# Patient Record
Sex: Female | Born: 1975 | Race: White | Hispanic: Yes | Marital: Married | Smoking: Never smoker
Health system: Southern US, Community
[De-identification: ages and names within clinical notes are randomized; demographics above are authoritative.]

## PROBLEM LIST (undated history)

## (undated) HISTORY — PX: OTHER SURGICAL HISTORY: SHX169

## (undated) HISTORY — PX: PLACEMENT OF BREAST IMPLANTS: SHX6334

## (undated) HISTORY — PX: BREAST CYST EXCISION: SHX579

## (undated) HISTORY — PX: AUGMENTATION MAMMAPLASTY: SUR837

---

## 1999-05-25 ENCOUNTER — Emergency Department (HOSPITAL_COMMUNITY): Admission: EM | Admit: 1999-05-25 | Discharge: 1999-05-25 | Payer: Self-pay | Admitting: Emergency Medicine

## 1999-07-31 ENCOUNTER — Ambulatory Visit (HOSPITAL_COMMUNITY): Admission: RE | Admit: 1999-07-31 | Discharge: 1999-07-31 | Payer: Self-pay | Admitting: Gynecology

## 1999-12-20 ENCOUNTER — Inpatient Hospital Stay (HOSPITAL_COMMUNITY): Admission: AD | Admit: 1999-12-20 | Discharge: 1999-12-22 | Payer: Self-pay | Admitting: Obstetrics and Gynecology

## 2000-08-13 ENCOUNTER — Ambulatory Visit (HOSPITAL_COMMUNITY): Admission: RE | Admit: 2000-08-13 | Discharge: 2000-08-13 | Payer: Self-pay | Admitting: Family Medicine

## 2000-08-13 ENCOUNTER — Encounter: Payer: Self-pay | Admitting: Family Medicine

## 2001-08-15 ENCOUNTER — Other Ambulatory Visit: Admission: RE | Admit: 2001-08-15 | Discharge: 2001-08-15 | Payer: Self-pay | Admitting: Obstetrics and Gynecology

## 2001-08-23 ENCOUNTER — Encounter: Payer: Self-pay | Admitting: Obstetrics and Gynecology

## 2001-08-23 ENCOUNTER — Encounter: Admission: RE | Admit: 2001-08-23 | Discharge: 2001-08-23 | Payer: Self-pay | Admitting: Obstetrics and Gynecology

## 2002-02-10 ENCOUNTER — Encounter: Admission: RE | Admit: 2002-02-10 | Discharge: 2002-02-10 | Payer: Self-pay | Admitting: Obstetrics and Gynecology

## 2002-02-10 ENCOUNTER — Encounter: Payer: Self-pay | Admitting: Obstetrics and Gynecology

## 2002-09-08 ENCOUNTER — Other Ambulatory Visit: Admission: RE | Admit: 2002-09-08 | Discharge: 2002-09-08 | Payer: Self-pay | Admitting: Obstetrics and Gynecology

## 2003-02-14 ENCOUNTER — Ambulatory Visit (HOSPITAL_COMMUNITY): Admission: RE | Admit: 2003-02-14 | Discharge: 2003-02-14 | Payer: Self-pay | Admitting: Plastic Surgery

## 2003-02-14 ENCOUNTER — Ambulatory Visit (HOSPITAL_BASED_OUTPATIENT_CLINIC_OR_DEPARTMENT_OTHER): Admission: RE | Admit: 2003-02-14 | Discharge: 2003-02-14 | Payer: Self-pay | Admitting: Plastic Surgery

## 2003-09-20 ENCOUNTER — Other Ambulatory Visit: Admission: RE | Admit: 2003-09-20 | Discharge: 2003-09-20 | Payer: Self-pay | Admitting: Obstetrics and Gynecology

## 2013-12-14 ENCOUNTER — Ambulatory Visit (INDEPENDENT_AMBULATORY_CARE_PROVIDER_SITE_OTHER): Payer: Self-pay | Admitting: Otolaryngology

## 2014-01-11 ENCOUNTER — Ambulatory Visit (INDEPENDENT_AMBULATORY_CARE_PROVIDER_SITE_OTHER): Payer: Managed Care, Other (non HMO) | Admitting: Otolaryngology

## 2014-01-11 DIAGNOSIS — H6120 Impacted cerumen, unspecified ear: Secondary | ICD-10-CM | POA: Diagnosis not present

## 2014-01-11 DIAGNOSIS — H9319 Tinnitus, unspecified ear: Secondary | ICD-10-CM | POA: Diagnosis not present

## 2014-01-11 DIAGNOSIS — H905 Unspecified sensorineural hearing loss: Secondary | ICD-10-CM

## 2014-07-19 ENCOUNTER — Ambulatory Visit (INDEPENDENT_AMBULATORY_CARE_PROVIDER_SITE_OTHER): Payer: Managed Care, Other (non HMO) | Admitting: Otolaryngology

## 2014-07-19 DIAGNOSIS — H9042 Sensorineural hearing loss, unilateral, left ear, with unrestricted hearing on the contralateral side: Secondary | ICD-10-CM

## 2015-03-07 ENCOUNTER — Ambulatory Visit (INDEPENDENT_AMBULATORY_CARE_PROVIDER_SITE_OTHER): Payer: Managed Care, Other (non HMO) | Admitting: Otolaryngology

## 2015-03-07 DIAGNOSIS — H9042 Sensorineural hearing loss, unilateral, left ear, with unrestricted hearing on the contralateral side: Secondary | ICD-10-CM

## 2015-09-05 ENCOUNTER — Ambulatory Visit (INDEPENDENT_AMBULATORY_CARE_PROVIDER_SITE_OTHER): Payer: Managed Care, Other (non HMO) | Admitting: Otolaryngology

## 2015-09-05 DIAGNOSIS — H9042 Sensorineural hearing loss, unilateral, left ear, with unrestricted hearing on the contralateral side: Secondary | ICD-10-CM

## 2016-09-03 ENCOUNTER — Ambulatory Visit (INDEPENDENT_AMBULATORY_CARE_PROVIDER_SITE_OTHER): Payer: Managed Care, Other (non HMO) | Admitting: Otolaryngology

## 2019-08-15 ENCOUNTER — Ambulatory Visit (INDEPENDENT_AMBULATORY_CARE_PROVIDER_SITE_OTHER): Payer: Self-pay | Admitting: Plastic Surgery

## 2019-08-15 ENCOUNTER — Other Ambulatory Visit: Payer: Self-pay

## 2019-08-15 ENCOUNTER — Encounter: Payer: Self-pay | Admitting: Plastic Surgery

## 2019-08-15 DIAGNOSIS — N6481 Ptosis of breast: Secondary | ICD-10-CM | POA: Insufficient documentation

## 2019-08-15 NOTE — Progress Notes (Signed)
Patient ID: Stacey Peters, female    DOB: [birth year], 44 y.o.   MRN: [id number]   Chief Complaint  Patient presents with  . Advice Only    for breast ptosis  . Breast Problem    The patient is a 44 year old female here for evaluation of her breasts.  She was referred by Dr. Benna Dunks.  The patient had implants placed in 2004 when she lived in [place].  She is not sure if they were saline or silicone.  She is not sure what size they were.  She went from a B cup of her bra to a DD bra cup.  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  She moved to [place] in 2012.  Dr. Benna Dunks removed the implants at that time.  She has not had any further surgery.  She had a mammogram last week and has dense breast tissue but no other issues noted.  She is 5 feet 4 inches tall and weighs 116 pounds.  She does not like the way her breasts look and would like to have them lifted.  On exam she has minimal breast tissue and loss of superior pole volume.  She is got some contracture at the lower medial pole of the left breast.  She does have breast asymmetry as well with the left looking a little bit smaller and more contracted than the right.  No lumps bumps or concerning areas for masses.  Her circumareolar scar is well-healed.  She is not sure if her implants were submuscular or subglandular.   Review of Systems  Constitutional: Negative.   HENT: Negative.   Eyes: Negative.   Respiratory: Negative.   Cardiovascular: Negative.   Gastrointestinal: Negative.   Endocrine: Negative.   Genitourinary: Negative.   Musculoskeletal: Negative.   Hematological: Negative.   Psychiatric/Behavioral: Negative.     History reviewed. No pertinent past medical history.  History reviewed. No pertinent surgical history.   No current outpatient medications on file.   Objective:   Vitals:   08/15/19 1311  BP: 105/67  Pulse: 83  Temp: 98 F (36.7 C)  SpO2: 100%    Physical  Exam Vitals and nursing note reviewed.  Constitutional:      Appearance: Normal appearance.  HENT:     Head: Normocephalic and atraumatic.  Cardiovascular:     Rate and Rhythm: Normal rate.     Pulses: Normal pulses.  Pulmonary:     Effort: Pulmonary effort is normal.  Abdominal:     General: Abdomen is flat. There is no distension.     Tenderness: There is no abdominal tenderness.  Skin:    General: Skin is warm.     Capillary Refill: Capillary refill takes less than 2 seconds.  Neurological:     General: No focal deficit present.     Mental Status: She is alert and oriented to person, place, and time.  Psychiatric:        Mood and Affect: Mood normal.        Behavior: Behavior normal.     Assessment & Plan:  Ptosis of both breasts   We discussed the options for implant-based mastopexy augmentation and mastopexy without an augmentation.  I think that she would look better with the augmentation mastopexy but I am very concerned about her previous history and the trouble that she may have with the implants.  I do not see any other great way of getting her more  volume.  Due to the areolar incision I do not think I could use the middle inferior portion of her breast to augment.  I think at best she could get a little bit tighter skin and the slightly higher areola positioning.  I recommend that she think it over and talk with her husband.  I am certainly available as needed.  She was given a quote for a mastopexy.   I did not give her a quote for the augmentation as I am aware Dr. Stephanie Coup could do this for her as well.  Pictures were obtained of the patient and placed in the chart with the patient's or guardian's permission.    Wallace Going, DO   The 21st Century Cures Act was signed into law in 2016 which includes the topic of electronic health records.  This provides immediate access to information in MyChart.  This includes consultation notes, operative notes, office  notes, lab results and pathology reports.  If you have any questions about what you read please let us know at your next visit or call us at the office.  We are right here with you.

## 2020-07-27 DIAGNOSIS — K6389 Other specified diseases of intestine: Secondary | ICD-10-CM | POA: Insufficient documentation

## 2020-07-27 NOTE — Progress Notes (Signed)
New Patient Office Visit  Subjective:  Patient ID: Stacey Peters, female    DOB: [birth year]  Age: 45 y.o. MRN: [id number]  CC:  Chief Complaint  Patient presents with  . Annual Exam    HPI Stacey Peters presents for establishment of care. She denies any acute complaints at today's visit.   Current Medications -no active medications  Medication Allergies -No known medication allergies  Past medical history  -small intestinal bacterial overgrowth  Surgical history:  -breast implant (bilateral)--date unknown -breast implant removal (bilateral)-2018 -breast lift (bilateral)-2018  Family History -father-colon cancer-deceased  Social History   Socioeconomic History  . Marital status: Married    Spouse name: Not on file  . Number of children: Not on file  . Years of education: Not on file  . Highest education level: Not on file  Occupational History  . Occupation: Customer service manager  Tobacco Use  . Smoking status: Never Smoker  . Smokeless tobacco: Never Used  Vaping Use  . Vaping Use: Never used  Substance and Sexual Activity  . Alcohol use: Yes    Comment: socially  . Drug use: Never  . Sexual activity: Not on file  Other Topics Concern  . Not on file  Social History Narrative  . Not on file   Social Determinants of Health   Financial Resource Strain: Not on file  Food Insecurity: Not on file  Transportation Needs: Not on file  Physical Activity: Not on file  Stress: Not on file  Social Connections: Not on file  Intimate Partner Violence: Not on file    ROS Review of Systems  Constitutional: Negative for appetite change, chills, fatigue, fever and unexpected weight change.  HENT: Positive for postnasal drip, rhinorrhea and sore throat. Negative for sinus pressure, trouble swallowing and voice change.   Eyes: Negative.   Respiratory: Negative for chest tightness and shortness of breath.   Cardiovascular: Negative for chest pain and palpitations.   Gastrointestinal: Negative for abdominal distention, abdominal pain and diarrhea.  Endocrine: Negative for cold intolerance, polydipsia and polyuria.  Genitourinary: Negative for decreased urine volume, dysuria, flank pain, frequency and vaginal discharge.  Musculoskeletal: Negative.   Skin: Negative.   Neurological: Negative.   Psychiatric/Behavioral: Negative.     Objective:   Today's Vitals: BP (!) 87/51 (BP Location: Left Arm, Patient Position: Sitting, Cuff Size: Normal)   Pulse (!) 51   Temp 98.1 F (36.7 C) (Oral)   Ht 5\' 4"  (1.626 m)   Wt 129 lb 9.6 oz (58.8 kg)   SpO2 100%   BMI 22.25 kg/m   Physical Exam Constitutional:      General: She is not in acute distress.    Appearance: Normal appearance.  HENT:     Head: Normocephalic.     Nose: Nose normal.     Mouth/Throat:     Mouth: Mucous membranes are moist.     Pharynx: No oropharyngeal exudate.  Eyes:     Extraocular Movements: Extraocular movements intact.  Cardiovascular:     Rate and Rhythm: Regular rhythm. Bradycardia present.     Heart sounds: No murmur heard. No gallop.   Pulmonary:     Effort: Pulmonary effort is normal.     Breath sounds: Normal breath sounds.  Abdominal:     General: Abdomen is flat.     Palpations: Abdomen is soft.  Musculoskeletal:        General: Normal range of motion.  Skin:    General: Skin  is warm and dry.  Neurological:     General: No focal deficit present.     Mental Status: She is alert and oriented to person, place, and time.  Psychiatric:        Mood and Affect: Mood normal.        Behavior: Behavior normal.     Assessment & Plan:   Problem List Items Addressed This Visit      Cardiovascular and Mediastinum   Hypotension with Bradycardia    Blood pressure was 85/65 and 87/51 on repeat today. Pulse 65 and 51 respectively.  She notes that her blood pressures typically range in the upper 80s to low 100s at baseline. Chart review from care everywhere  confirms this.  She is asymptomatic.  Assessment: Orthostatic vitals are not consistent with orthostasis. EKG was obtained at today's visit as well which confirmed sinus bradycardia She is not on any medications at this time. TSH was normal a few months ago so I don't think we need to check that again. Adrenal insufficiency considered and remains on the differential however she is not symptomatic and recent labs and clinic findings not present (fatigue, wt loss, n/v, abd pain, muscle/joint pain, salt craving, orthostasis)  Plan -obtain AM cortisol this week -BMP/CBC -f/u in 2w for a recheck. If she remains hypotensive/bradycardic at that time, may consider cardiology f/u         Relevant Orders   Cortisol-am, blood     Digestive   Small intestinal bacterial overgrowth    She would like to transfer care from [facility] to a GI office here in [place].  Reports an improvement in GI symptoms since completing treatment. -referral to GI placed      Relevant Orders   Ambulatory referral to Gastroenterology     Other   Healthcare maintenance    Follows with gynecology in [place] for pap smears Last mammogram was 08/07/19 BI-RADS 2-benign HCV and HIV screening tests ordered. Received dose dose series of COVID 19 vaccine--unsure which one. Not interested in the booster.        Other Visit Diagnoses    Bradycardia    -  Primary   Relevant Orders   EKG 12-Lead (Completed)   CBC no Diff   CMP14 + Anion Gap   Screening for HIV (human immunodeficiency virus)       Relevant Orders   HIV antibody (with reflex)   Encounter for HCV screening test for low risk patient       Relevant Orders   Hepatitis C antibody   Encounter for screening mammogram for malignant neoplasm of breast       Relevant Orders   MM Digital Screening      No outpatient encounter medications on file as of 07/29/2020.   No facility-administered encounter medications on file as of 07/29/2020.     Follow-up: Return in about 2 weeks (around 08/12/2020) for Blood pressure recheck.   Patient discussed with Dr. Leo Rod, MD Internal Medicine Resident PGY-2 [facility] Pager: [phone] 07/29/2020 1:59 PM

## 2020-07-29 ENCOUNTER — Other Ambulatory Visit: Payer: Self-pay

## 2020-07-29 ENCOUNTER — Ambulatory Visit (INDEPENDENT_AMBULATORY_CARE_PROVIDER_SITE_OTHER): Payer: Managed Care, Other (non HMO) | Admitting: Internal Medicine

## 2020-07-29 ENCOUNTER — Encounter: Payer: Self-pay | Admitting: Internal Medicine

## 2020-07-29 ENCOUNTER — Ambulatory Visit (HOSPITAL_COMMUNITY)
Admission: RE | Admit: 2020-07-29 | Discharge: 2020-07-29 | Disposition: A | Discharge status: Home / Self Care | Payer: Managed Care, Other (non HMO) | Source: Ambulatory Visit | Attending: Family Medicine | Admitting: Family Medicine

## 2020-07-29 VITALS — BP 87/51 | HR 51 | Temp 98.1°F | Ht 64.0 in | Wt 129.6 lb

## 2020-07-29 DIAGNOSIS — Z1231 Encounter for screening mammogram for malignant neoplasm of breast: Secondary | ICD-10-CM

## 2020-07-29 DIAGNOSIS — I9589 Other hypotension: Secondary | ICD-10-CM | POA: Insufficient documentation

## 2020-07-29 DIAGNOSIS — I959 Hypotension, unspecified: Secondary | ICD-10-CM | POA: Diagnosis not present

## 2020-07-29 DIAGNOSIS — K6389 Other specified diseases of intestine: Secondary | ICD-10-CM

## 2020-07-29 DIAGNOSIS — Z114 Encounter for screening for human immunodeficiency virus [HIV]: Secondary | ICD-10-CM | POA: Diagnosis not present

## 2020-07-29 DIAGNOSIS — Z1159 Encounter for screening for other viral diseases: Secondary | ICD-10-CM

## 2020-07-29 DIAGNOSIS — R001 Bradycardia, unspecified: Secondary | ICD-10-CM

## 2020-07-29 DIAGNOSIS — Z0000 Encounter for general adult medical examination without abnormal findings: Secondary | ICD-10-CM

## 2020-07-29 NOTE — Patient Instructions (Signed)
It was a pleasure meeting you today and I thank you for choosing the [facility] Medicine Center for your primary care.  I would like you to return later this week in the morning so we can check a lab that can only be obtained in the morning. This can be a lab-only appointment, meaning you do not have to see a provider that day.  Please arrive by 8:15 to check any day Monday through Thursday. Our clinic opens later on Fridays so this lab would be less reliable between those times.  I will call you to discuss your labs once they have resulted.

## 2020-07-29 NOTE — Assessment & Plan Note (Addendum)
Follows with gynecology in [place] for pap smears Last mammogram was 08/07/19 BI-RADS 2-benign HCV and HIV screening tests ordered. Received dose dose series of COVID 19 vaccine--unsure which one. Not interested in the booster.

## 2020-07-29 NOTE — Assessment & Plan Note (Signed)
She would like to transfer care from [facility] to a GI office here in [place].  Reports an improvement in GI symptoms since completing treatment. -referral to GI placed

## 2020-07-29 NOTE — Assessment & Plan Note (Addendum)
Blood pressure was 85/65 and 87/51 on repeat today. Pulse 65 and 51 respectively.  She notes that her blood pressures typically range in the upper 80s to low 100s at baseline. Chart review from care everywhere confirms this.  She is asymptomatic.  Assessment: Orthostatic vitals are not consistent with orthostasis. EKG was obtained at today's visit as well which confirmed sinus bradycardia She is not on any medications at this time. TSH was normal a few months ago so I don't think we need to check that again. Adrenal insufficiency considered and remains on the differential however she is not symptomatic and recent labs and clinic findings not present (fatigue, wt loss, n/v, abd pain, muscle/joint pain, salt craving, orthostasis)  Plan -obtain AM cortisol this week -BMP/CBC -f/u in 2w for a recheck. If she remains hypotensive/bradycardic at that time, may consider cardiology f/u

## 2020-07-29 NOTE — Progress Notes (Signed)
Internal Medicine Clinic Attending  Case discussed with Dr. Christian  At the time of the visit.  We reviewed the resident's history and exam and pertinent patient test results.  I agree with the assessment, diagnosis, and plan of care documented in the resident's note.  

## 2020-07-30 ENCOUNTER — Other Ambulatory Visit (INDEPENDENT_AMBULATORY_CARE_PROVIDER_SITE_OTHER): Payer: Managed Care, Other (non HMO)

## 2020-07-30 DIAGNOSIS — Z1159 Encounter for screening for other viral diseases: Secondary | ICD-10-CM

## 2020-07-30 DIAGNOSIS — Z114 Encounter for screening for human immunodeficiency virus [HIV]: Secondary | ICD-10-CM | POA: Diagnosis not present

## 2020-07-30 DIAGNOSIS — R001 Bradycardia, unspecified: Secondary | ICD-10-CM

## 2020-07-30 DIAGNOSIS — I959 Hypotension, unspecified: Secondary | ICD-10-CM

## 2020-07-31 LAB — CMP14 + ANION GAP
ALT: 9 IU/L (ref 0–32)
AST: 15 IU/L (ref 0–40)
Albumin/Globulin Ratio: 2 (ref 1.2–2.2)
Albumin: 4.3 g/dL (ref 3.8–4.8)
Alkaline Phosphatase: 62 IU/L (ref 44–121)
Anion Gap: 14 mmol/L (ref 10.0–18.0)
BUN/Creatinine Ratio: 15 (ref 9–23)
BUN: 11 mg/dL (ref 6–24)
Bilirubin Total: 0.4 mg/dL (ref 0.0–1.2)
CO2: 23 mmol/L (ref 20–29)
Calcium: 9 mg/dL (ref 8.7–10.2)
Chloride: 102 mmol/L (ref 96–106)
Creatinine, Ser: 0.75 mg/dL (ref 0.57–1.00)
Globulin, Total: 2.2 g/dL (ref 1.5–4.5)
Glucose: 80 mg/dL (ref 65–99)
Potassium: 4.1 mmol/L (ref 3.5–5.2)
Sodium: 139 mmol/L (ref 134–144)
Total Protein: 6.5 g/dL (ref 6.0–8.5)
eGFR: 101 mL/min/{1.73_m2} (ref >59)

## 2020-07-31 LAB — CBC
Hematocrit: 43.6 % (ref 34.0–46.6)
Hemoglobin: 15 g/dL (ref 11.1–15.9)
MCH: 31.5 pg (ref 26.6–33.0)
MCHC: 34.4 g/dL (ref 31.5–35.7)
MCV: 92 fL (ref 79–97)
Platelets: 208 10*3/uL (ref 150–450)
RBC: 4.76 x10E6/uL (ref 3.77–5.28)
RDW: 13.1 % (ref 11.7–15.4)
WBC: 6 10*3/uL (ref 3.4–10.8)

## 2020-07-31 LAB — HIV ANTIBODY (ROUTINE TESTING W REFLEX): HIV Screen 4th Generation wRfx: NONREACTIVE

## 2020-07-31 LAB — CORTISOL-AM, BLOOD: Cortisol - AM: 14.7 ug/dL (ref 6.2–19.4)

## 2020-07-31 LAB — HEPATITIS C ANTIBODY: Hep C Virus Ab: 0.2 s/co ratio (ref 0.0–0.9)

## 2020-07-31 NOTE — Assessment & Plan Note (Signed)
AM cortisol is normal. No further workup is indicated at this time as she is asymptomatic. Indications to pursue further evaluation include becoming symptomatic or if hypotension/bradycardia worsen.

## 2020-09-16 ENCOUNTER — Other Ambulatory Visit: Payer: Self-pay | Admitting: Internal Medicine

## 2020-09-16 DIAGNOSIS — Z1231 Encounter for screening mammogram for malignant neoplasm of breast: Secondary | ICD-10-CM

## 2020-11-14 ENCOUNTER — Other Ambulatory Visit: Payer: Self-pay

## 2020-11-14 ENCOUNTER — Ambulatory Visit
Admission: RE | Admit: 2020-11-14 | Discharge: 2020-11-14 | Disposition: A | Discharge status: Home / Self Care | Payer: Managed Care, Other (non HMO) | Source: Ambulatory Visit | Attending: Internal Medicine | Admitting: Internal Medicine

## 2020-11-14 DIAGNOSIS — Z1231 Encounter for screening mammogram for malignant neoplasm of breast: Secondary | ICD-10-CM

## 2021-02-20 ENCOUNTER — Encounter: Payer: Managed Care, Other (non HMO) | Admitting: Student

## 2021-02-20 ENCOUNTER — Encounter: Payer: Self-pay | Admitting: Student

## 2021-02-20 ENCOUNTER — Ambulatory Visit (INDEPENDENT_AMBULATORY_CARE_PROVIDER_SITE_OTHER): Payer: Managed Care, Other (non HMO) | Admitting: Student

## 2021-02-20 ENCOUNTER — Other Ambulatory Visit: Payer: Self-pay

## 2021-02-20 VITALS — BP 99/56 | HR 71 | Temp 97.9°F | Ht 64.0 in | Wt 130.2 lb

## 2021-02-20 DIAGNOSIS — Z131 Encounter for screening for diabetes mellitus: Secondary | ICD-10-CM | POA: Diagnosis not present

## 2021-02-20 DIAGNOSIS — Z0000 Encounter for general adult medical examination without abnormal findings: Secondary | ICD-10-CM | POA: Diagnosis not present

## 2021-02-20 DIAGNOSIS — I9589 Other hypotension: Secondary | ICD-10-CM

## 2021-02-20 DIAGNOSIS — Z13220 Encounter for screening for lipoid disorders: Secondary | ICD-10-CM | POA: Diagnosis not present

## 2021-02-20 NOTE — Assessment & Plan Note (Addendum)
Patient follows with GYN in [place], will follow up with them for her pap screening. Screening lipid panel and A1c pending. Patient will follow up with her GI physician for her colonoscopy. Father passed of colon cancer and patient has been having early screening.   Addendum A1c of 4.9.  Total cholesterol of 184 and LDL of 120.  ASCVD risk 0.5%, will continue to monitor

## 2021-02-20 NOTE — Progress Notes (Signed)
   CC: Follow Up - Hypotension   HPI:  Stacey Peters is a 45 y.o. female with a past medical history stated below and presents today for follow up concerning her persistent hypotension. Please see problem based assessment and plan for additional details.  PMHx Hypotension  No current outpatient medications on file prior to visit.   No current facility-administered medications on file prior to visit.    Family History  Problem Relation Age of Onset   Colon cancer Father    Breast cancer Neg Hx     Social History   Socioeconomic History   Marital status: Married    Spouse name: Not on file   Number of children: Not on file   Years of education: Not on file   Highest education level: Not on file  Occupational History   Occupation: [employer] account manager  Tobacco Use   Smoking status: Never   Smokeless tobacco: Never  Vaping Use   Vaping Use: Never used  Substance and Sexual Activity   Alcohol use: Yes    Comment: socially   Drug use: Never   Sexual activity: Not on file  Other Topics Concern   Not on file  Social History Narrative   Not on file   Social Determinants of Health   Financial Resource Strain: Not on file  Food Insecurity: Not on file  Transportation Needs: Not on file  Physical Activity: Not on file  Stress: Not on file  Social Connections: Not on file  Intimate Partner Violence: Not on file    Review of Systems: ROS negative except for what is noted on the assessment and plan.  Vitals:   02/20/21 1026 02/20/21 1035  BP: (!) 76/19 (!) 99/56  Pulse: (!) 56 71  Temp: 97.9 F (36.6 C)   TempSrc: Oral   SpO2: 100%   Weight: 130 lb 3.2 oz (59.1 kg)   Height: 5\' 4"  (1.626 m)    Physical Exam: Constitutional: well-appearing, in no acute distress HENT: normocephalic atraumatic Eyes: conjunctiva non-erythematous Neck: supple Cardiovascular: regular rate and rhythm, no m/r/g Pulmonary/Chest: normal work of breathing on room air, lungs clear  to auscultation bilaterally MSK: normal bulk and tone Neurological: alert & oriented x 3 Skin: warm and dry Psych: Normal mood and thought process  Assessment & Plan:   See Encounters Tab for problem based charting.  Patient discussed with Dr. , D.O. [facility] Internal Medicine, PGY-2 Pager: [phone], Phone: [phone] Date 02/20/2021 Time 1:42 PM

## 2021-02-20 NOTE — Assessment & Plan Note (Addendum)
Assessment: Patient presented to [facility] today for f/u concerning her persistent hypotension. BP improved today from last visit, 99/56. Initial BP was 76/19, however, upon recheck improved. She denies any symptoms at this time; lightheadedness, dizziness, syncope, chest pain, heart palpitations. She denies family history of sudden cardiac death.   During prior visit her workup thus far has been negative for any etiology. Normal TSH and AM cortisol. EKG with bradycardia and sinus arrythmia, but no other abnormalities. Orthostatic vital signs also negative during last visit.   On physical exam, patient is bradycardic in the 50's with normal heart sounds. She has no signs of hypoperfusion and her Cr was normal on her BMP during her last visit.   Work-up has been negative for cardiac and endocrine, or autoimmune etiology. Does not appear to be hypoperfusing. Believe etiology of bradycardia is likely her high activity level, frequently running 5 miles a day. Patient with chronic asymptomatic hypotension. Informed patient we will continue to monitor for symptoms such as lightheadedness, syncope, and dizziness. If any occur, believe patient will need further workup with possible echocardiogram  Plan: -continue to monitor, if symptomatic will initiate appropriate workup

## 2021-02-20 NOTE — Patient Instructions (Signed)
Thank you, Ms.Stacey Peters Late for allowing [place] to provide your care today. Today we discussed .  Low blood pressure Am glad to hear that you are not having symptoms from this.  If you do notice any symptoms of lightheadedness dizziness, fluttering in your chest or episodes of passing out.  Or any other new symptoms that seem out of the usual, please call her clinic to be seen.  Screening tests We will be checking your cholesterol levels as well as screen you for diabetes.  I will call you with these results.  Please follow-up with your OB/GYN for your Pap smear testing and follow-up with your GI doctor for your colonoscopy.  If you would prefer to see someone within the [facility] health system we are more than happy to place a referral for this testing.  I have ordered the following labs for you:  Lab Orders         Lipid Profile         Hemoglobin A1c       Referrals ordered today:   Referral Orders  No referral(s) requested today     I have ordered the following medication/changed the following medications:   Stop the following medications: There are no discontinued medications.   Start the following medications: No orders of the defined types were placed in this encounter.    Follow up:  As needed or 1 year     Remember: Any new symptoms please call our clinic  Should you have any questions or concerns please call the internal medicine clinic at [phone].    Thalia Bloodgood, D.O. [facility]

## 2021-02-21 LAB — LIPID PANEL
Chol/HDL Ratio: 3.8 ratio (ref 0.0–4.4)
Cholesterol, Total: 184 mg/dL (ref 100–199)
HDL: 49 mg/dL (ref >39)
LDL Chol Calc (NIH): 120 mg/dL — ABNORMAL HIGH (ref 0–99)
Triglycerides: 83 mg/dL (ref 0–149)
VLDL Cholesterol Cal: 15 mg/dL (ref 5–40)

## 2021-02-21 LAB — HEMOGLOBIN A1C
Est. average glucose Bld gHb Est-mCnc: 94 mg/dL
Hgb A1c MFr Bld: 4.9 % (ref 4.8–5.6)

## 2021-02-25 NOTE — Progress Notes (Signed)
Internal Medicine Clinic Attending  Case discussed with Dr. Katsadouros  At the time of the visit.  We reviewed the resident's history and exam and pertinent patient test results.  I agree with the assessment, diagnosis, and plan of care documented in the resident's note.  

## 2021-10-10 ENCOUNTER — Other Ambulatory Visit: Payer: Self-pay | Admitting: Internal Medicine

## 2021-10-10 DIAGNOSIS — Z1231 Encounter for screening mammogram for malignant neoplasm of breast: Secondary | ICD-10-CM

## 2021-11-03 ENCOUNTER — Encounter: Payer: Self-pay | Admitting: *Deleted

## 2021-11-17 ENCOUNTER — Ambulatory Visit
Admission: RE | Admit: 2021-11-17 | Discharge: 2021-11-17 | Disposition: A | Discharge status: Home / Self Care | Payer: Managed Care, Other (non HMO) | Source: Ambulatory Visit | Attending: Internal Medicine | Admitting: Internal Medicine

## 2021-11-17 DIAGNOSIS — Z1231 Encounter for screening mammogram for malignant neoplasm of breast: Secondary | ICD-10-CM

## 2021-11-18 ENCOUNTER — Other Ambulatory Visit: Payer: Self-pay | Admitting: Internal Medicine

## 2021-11-18 DIAGNOSIS — R928 Other abnormal and inconclusive findings on diagnostic imaging of breast: Secondary | ICD-10-CM

## 2021-11-27 ENCOUNTER — Other Ambulatory Visit: Payer: Self-pay | Admitting: Internal Medicine

## 2021-11-27 ENCOUNTER — Ambulatory Visit
Admission: RE | Admit: 2021-11-27 | Discharge: 2021-11-27 | Disposition: A | Discharge status: Home / Self Care | Payer: Managed Care, Other (non HMO) | Source: Ambulatory Visit | Attending: Internal Medicine | Admitting: Internal Medicine

## 2021-11-27 DIAGNOSIS — N6324 Unspecified lump in the left breast, lower inner quadrant: Secondary | ICD-10-CM

## 2021-11-27 DIAGNOSIS — R928 Other abnormal and inconclusive findings on diagnostic imaging of breast: Secondary | ICD-10-CM

## 2021-12-16 ENCOUNTER — Ambulatory Visit (INDEPENDENT_AMBULATORY_CARE_PROVIDER_SITE_OTHER): Payer: Managed Care, Other (non HMO) | Admitting: Podiatry

## 2021-12-16 ENCOUNTER — Other Ambulatory Visit: Payer: Self-pay | Admitting: Podiatry

## 2021-12-16 ENCOUNTER — Encounter: Payer: Self-pay | Admitting: Podiatry

## 2021-12-16 DIAGNOSIS — B351 Tinea unguium: Secondary | ICD-10-CM | POA: Diagnosis not present

## 2021-12-16 DIAGNOSIS — Z79899 Other long term (current) drug therapy: Secondary | ICD-10-CM

## 2021-12-16 NOTE — Progress Notes (Signed)
Subjective:  Patient ID: Stacey Peters, female    DOB: 11-30-1975,  MRN: [id number]  Chief Complaint  Patient presents with   Nail Problem    46 y.o. female presents with the above complaint.  Patient presents with left hallux thickened elongated dystrophic toenails x 1.  Patient states that she had her nail falling off she goes to the nail salon.  She wanted get it evaluated she would like to discuss treatment options for it.  It does not hurt does not cause her any pain.  There is some green discoloration as well.  She has tried ciclopirox treatment for it as well.   Review of Systems: Negative except as noted in the HPI. Denies N/V/F/Ch.  History reviewed. No pertinent past medical history.  Current Outpatient Medications:    Ciclopirox (CICLOPIROX TREATMENT) 8 % KIT, Apply topically daily., Disp: , Rfl:    clindamycin (CLEOCIN) 2 % vaginal cream, Apply topically., Disp: , Rfl:    Fluocinolone Acetonide 0.01 % OIL, INSTIL 4 DROPS AS NEEDED FOR ITCHING, Disp: , Rfl:    fluticasone (FLONASE) 50 MCG/ACT nasal spray, SPRAY 2 SPRAYS BY NASAL ROUTE DAILY FOR 30 DAYS., Disp: , Rfl:    metroNIDAZOLE (FLAGYL) 500 MG tablet, Take by mouth., Disp: , Rfl:    omeprazole (PRILOSEC) 40 MG capsule, TAKE 1 CAPSULE (40 MG TOTAL) BY MOUTH DAILY FOR 30 DAYS., Disp: , Rfl:    ranitidine (ZANTAC) 150 MG capsule, Take by mouth., Disp: , Rfl:    Cholecalciferol (VITAMIN D-1000 MAX ST) 25 MCG (1000 UT) tablet, Take by mouth., Disp: , Rfl:    doxycycline (VIBRAMYCIN) 100 MG capsule, Take 100 mg by mouth 2 (two) times daily., Disp: , Rfl:    levonorgestrel (MIRENA) 20 MCG/DAY IUD, by Intrauterine route., Disp: , Rfl:    Multiple Vitamin (MULTIVITAMIN) capsule, Take by mouth., Disp: , Rfl:    nitrofurantoin, macrocrystal-monohydrate, (MACROBID) 100 MG capsule, Take 100 mg by mouth every 12 (twelve) hours., Disp: , Rfl:    Probiotic Product (PROBIOTIC & ACIDOPHILUS EX ST) CAPS, Take by mouth., Disp: , Rfl:     triamcinolone ointment (KENALOG) 0.1 %, Apply topically 2 (two) times daily., Disp: , Rfl:   Social History   Tobacco Use  Smoking Status Never  Smokeless Tobacco Never    No Known Allergies Objective:  There were no vitals filed for this visit. There is no height or weight on file to calculate BMI. Constitutional Well developed. Well nourished.  Vascular Dorsalis pedis pulses palpable bilaterally. Posterior tibial pulses palpable bilaterally. Capillary refill normal to all digits.  No cyanosis or clubbing noted. Pedal hair growth normal.  Neurologic Normal speech. Oriented to person, place, and time. Epicritic sensation to light touch grossly present bilaterally.  Dermatologic Nails thickened and again dystrophic mycotic toenails x 1 left hallux Skin within normal limits  Orthopedic: Normal joint ROM without pain or crepitus bilaterally. No visible deformities. No bony tenderness.   Radiographs: None Assessment:   1. Long-term use of high-risk medication   2. Nail fungus   3. Onychomycosis due to dermatophyte    Plan:  Patient was evaluated and treated and all questions answered.  Left hallux onychomycosis -Educated the patient on the etiology of onychomycosis and various treatment options associated with improving the fungal load.  I explained to the patient that there is 3 treatment options available to treat the onychomycosis including topical, p.o., laser treatment.  Patient elected to undergo p.o. options with Lamisil/terbinafine therapy.  In  order for me to start the medication therapy, I explained to the patient the importance of evaluating the liver and obtaining the liver function test.  Once the liver function test comes back normal I will start him on 63-monthcourse of Lamisil therapy.  Patient understood all risk and would like to proceed with Lamisil therapy.  I have asked the patient to immediately stop the Lamisil therapy if she has any reactions to it and call  the office or go to the emergency room right away.  Patient states understanding   No follow-ups on file.

## 2021-12-17 LAB — HEPATIC FUNCTION PANEL
ALT: 12 IU/L (ref 0–32)
AST: 15 IU/L (ref 0–40)
Albumin: 4.4 g/dL (ref 3.9–4.9)
Alkaline Phosphatase: 64 IU/L (ref 44–121)
Bilirubin Total: 0.4 mg/dL (ref 0.0–1.2)
Bilirubin, Direct: 0.14 mg/dL (ref 0.00–0.40)
Total Protein: 6.5 g/dL (ref 6.0–8.5)

## 2021-12-22 MED ORDER — TERBINAFINE HCL 250 MG PO TABS: 250.0000 mg | ORAL_TABLET | Freq: Every day | ORAL | 0 refills | Status: AC

## 2021-12-22 NOTE — Addendum Note (Signed)
Addended by: Nicholes Rough on: 12/22/2021 07:33 AM   Modules accepted: Orders

## 2022-04-16 ENCOUNTER — Ambulatory Visit: Payer: Managed Care, Other (non HMO) | Admitting: Podiatry

## 2022-06-01 ENCOUNTER — Inpatient Hospital Stay: Admission: RE | Admit: 2022-06-01 | Payer: Managed Care, Other (non HMO) | Source: Ambulatory Visit

## 2022-06-03 ENCOUNTER — Other Ambulatory Visit: Payer: Self-pay | Admitting: Internal Medicine

## 2022-06-03 ENCOUNTER — Ambulatory Visit
Admission: RE | Admit: 2022-06-03 | Discharge: 2022-06-03 | Disposition: A | Discharge status: Home / Self Care | Payer: Managed Care, Other (non HMO) | Source: Ambulatory Visit | Attending: Internal Medicine | Admitting: Internal Medicine

## 2022-06-03 DIAGNOSIS — N6324 Unspecified lump in the left breast, lower inner quadrant: Secondary | ICD-10-CM

## 2022-06-08 ENCOUNTER — Ambulatory Visit
Admission: RE | Admit: 2022-06-08 | Discharge: 2022-06-08 | Disposition: A | Discharge status: Home / Self Care | Payer: Managed Care, Other (non HMO) | Source: Ambulatory Visit | Attending: Internal Medicine | Admitting: Internal Medicine

## 2022-06-08 ENCOUNTER — Ambulatory Visit
Admission: RE | Admit: 2022-06-08 | Discharge: 2022-06-08 | Disposition: A | Discharge status: Home / Self Care | Payer: Managed Care, Other (non HMO) | Source: Ambulatory Visit | Attending: Internal Medicine

## 2022-06-08 ENCOUNTER — Other Ambulatory Visit: Payer: Self-pay | Admitting: Internal Medicine

## 2022-06-08 DIAGNOSIS — N6324 Unspecified lump in the left breast, lower inner quadrant: Secondary | ICD-10-CM

## 2022-06-08 HISTORY — PX: BREAST BIOPSY: SHX20

## 2022-10-21 IMAGING — MG MM DIGITAL SCREENING BILAT W/ TOMO AND CAD
8 series · 9 of 24 positions shown · non-contrast
Comparison: Previous exam(s).

CLINICAL DATA: Screening.

EXAM:
DIGITAL SCREENING BILATERAL MAMMOGRAM WITH TOMOSYNTHESIS AND CAD
TECHNIQUE: Bilateral screening digital craniocaudal and mediolateral oblique
mammograms were obtained. Bilateral screening digital breast
tomosynthesis was performed. The images were evaluated with
computer-aided detection.

[R MLO synth-2D]
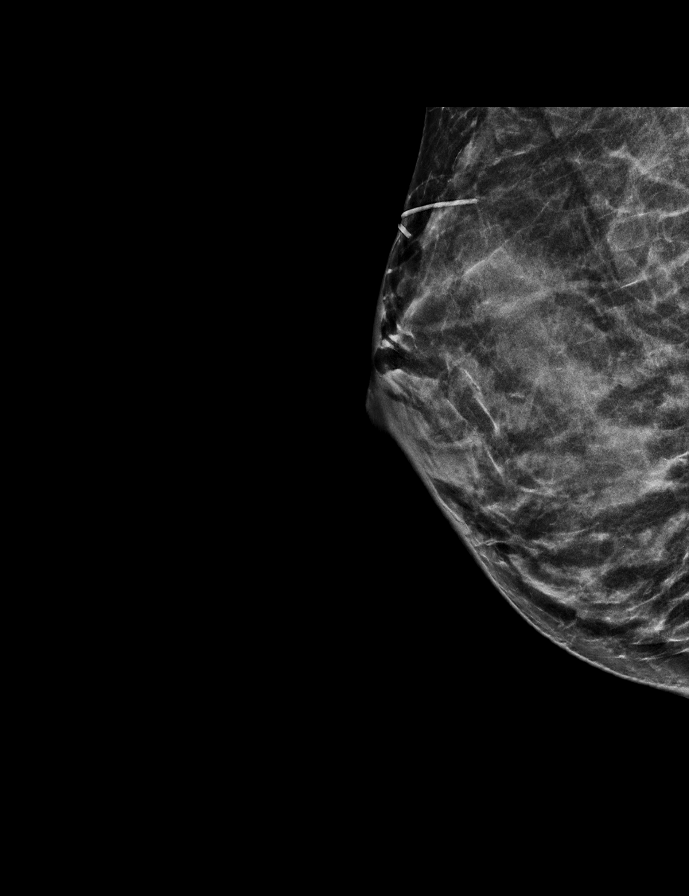

[L CC synth-2D]
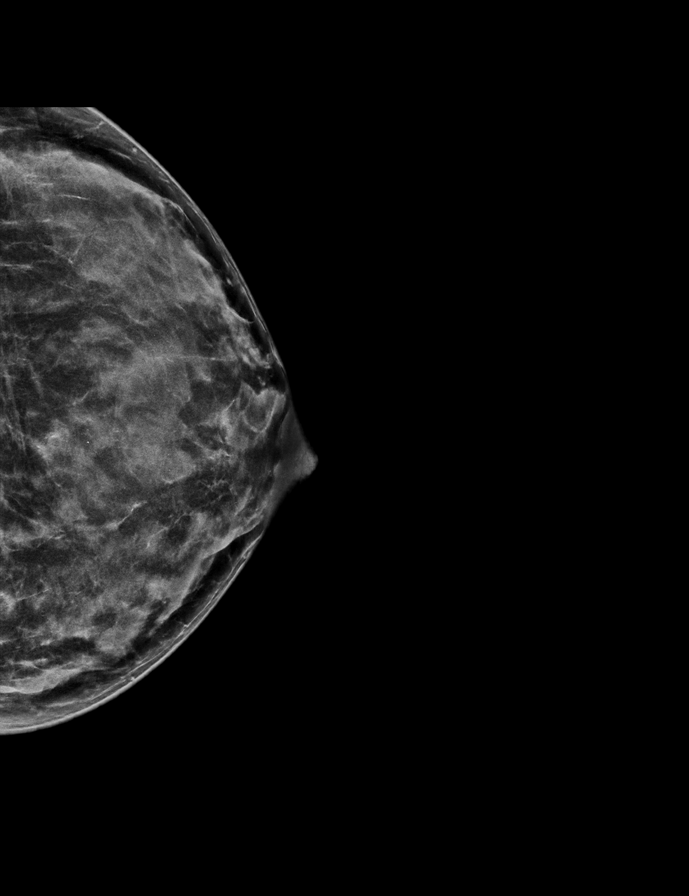

[R CC synth-2D]
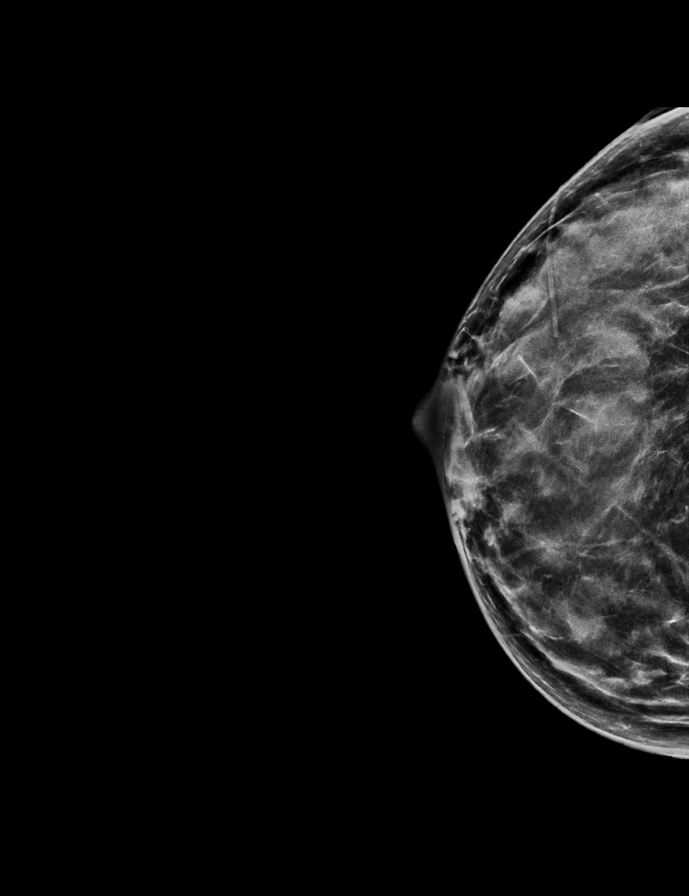

[L MLO synth-2D]
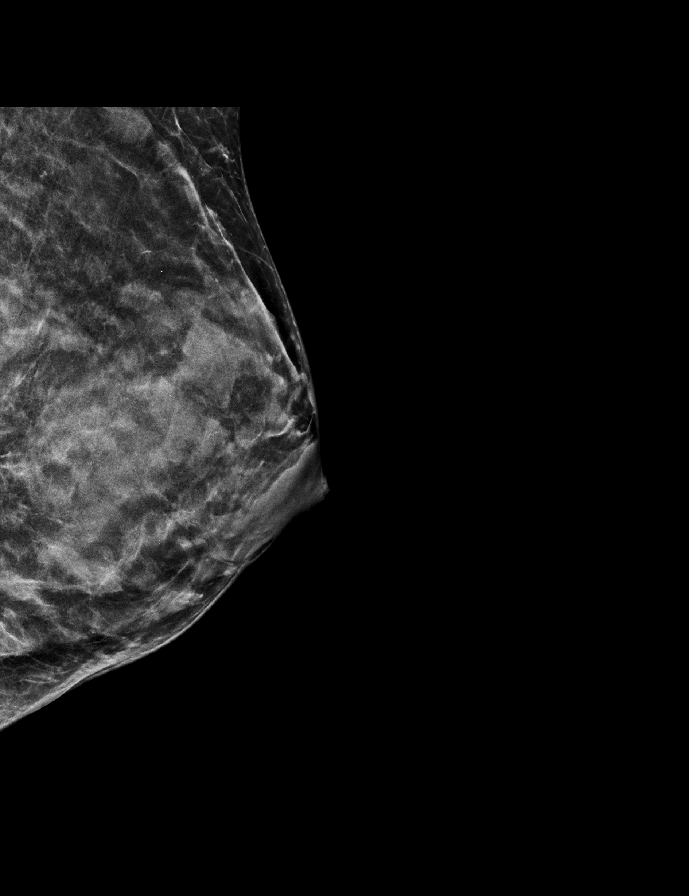

[R MLO tomo · 2 of 44 frames shown]
[frame 15/44]
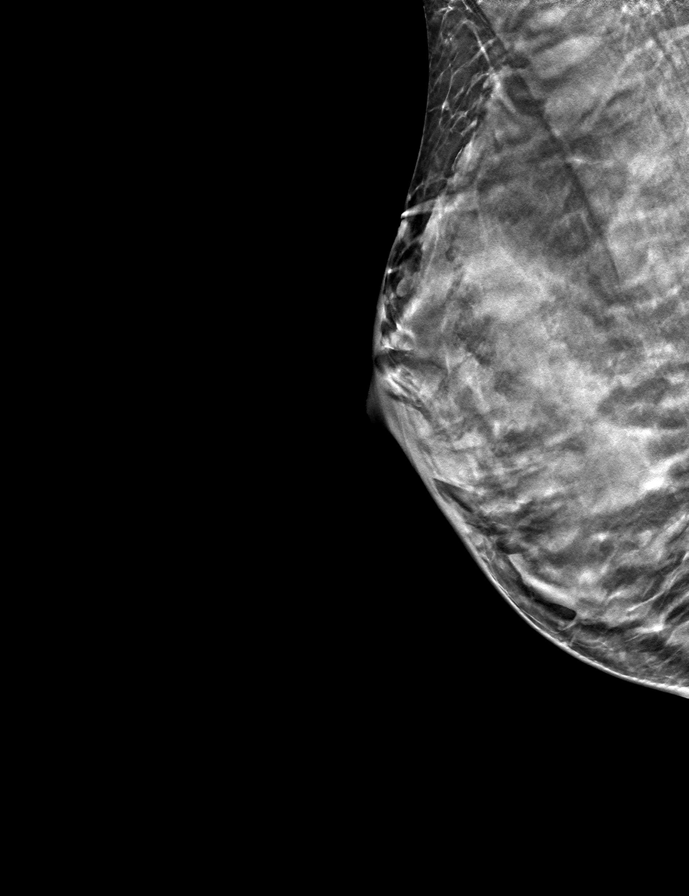
[frame 23/44]
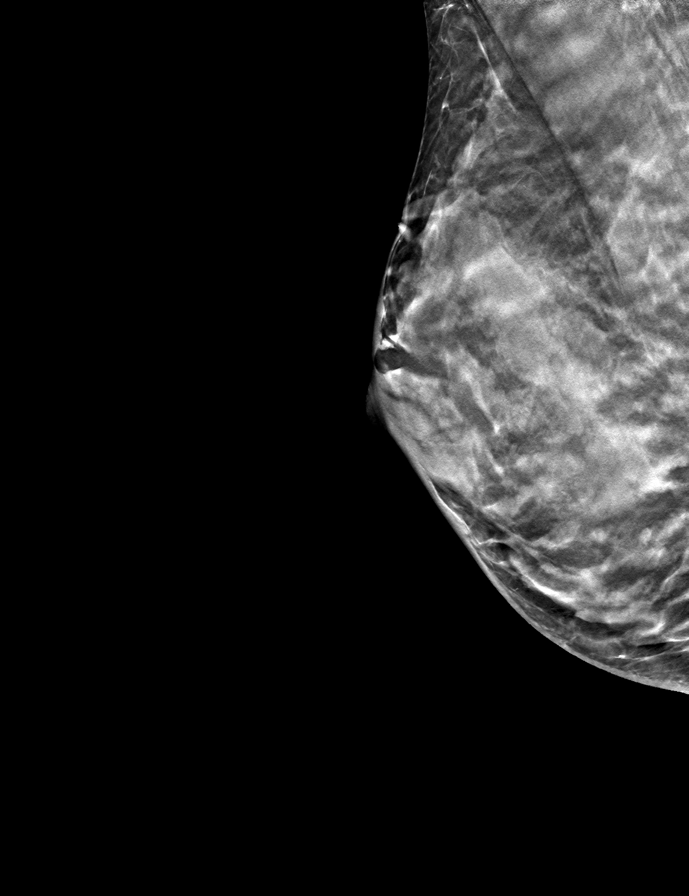

[R CC tomo · tomo slice 25/50.0]
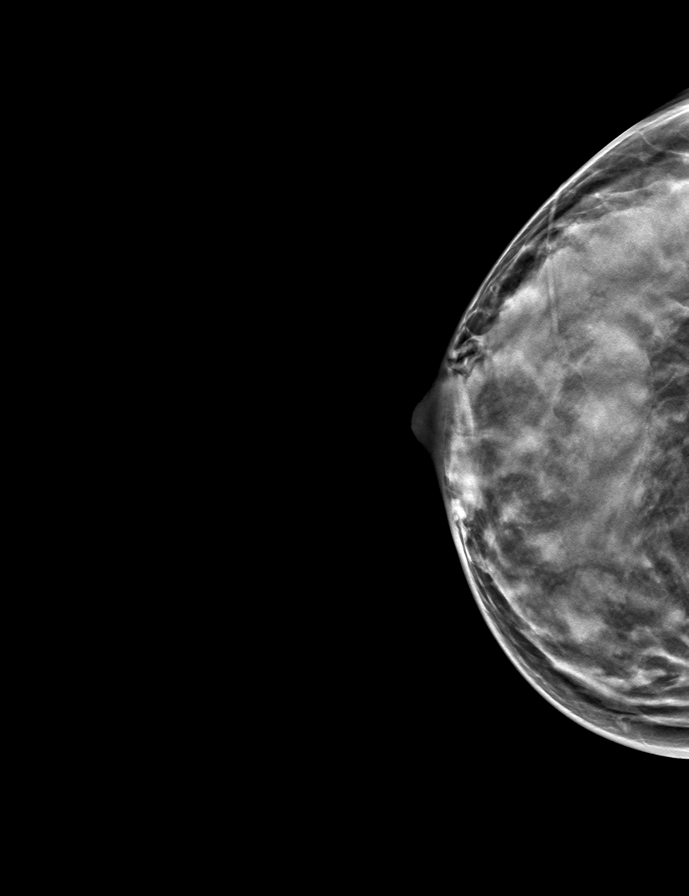

[L MLO tomo · tomo slice 23/45.0]
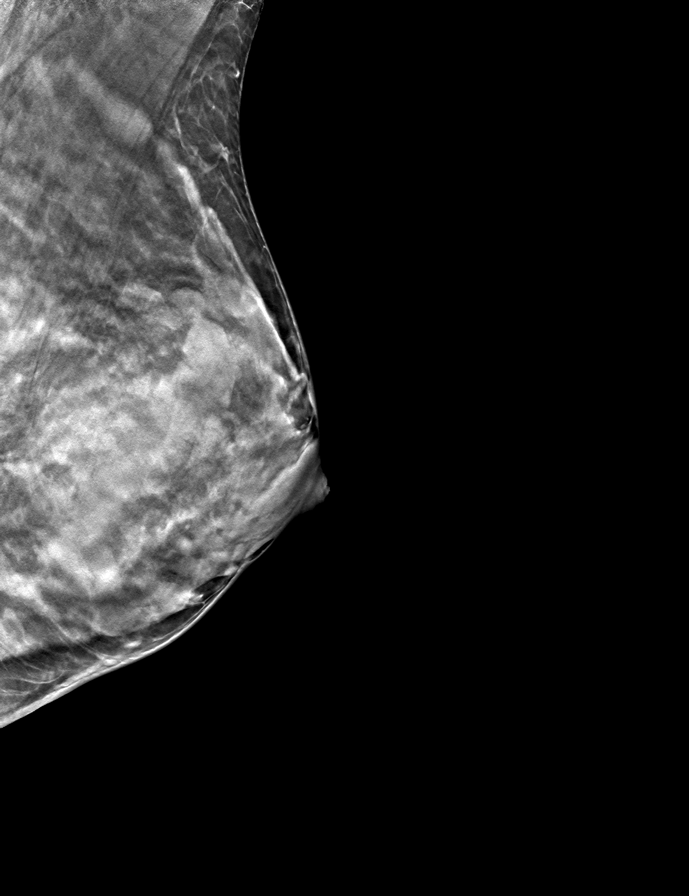

[L CC tomo · tomo slice 25/49.0]
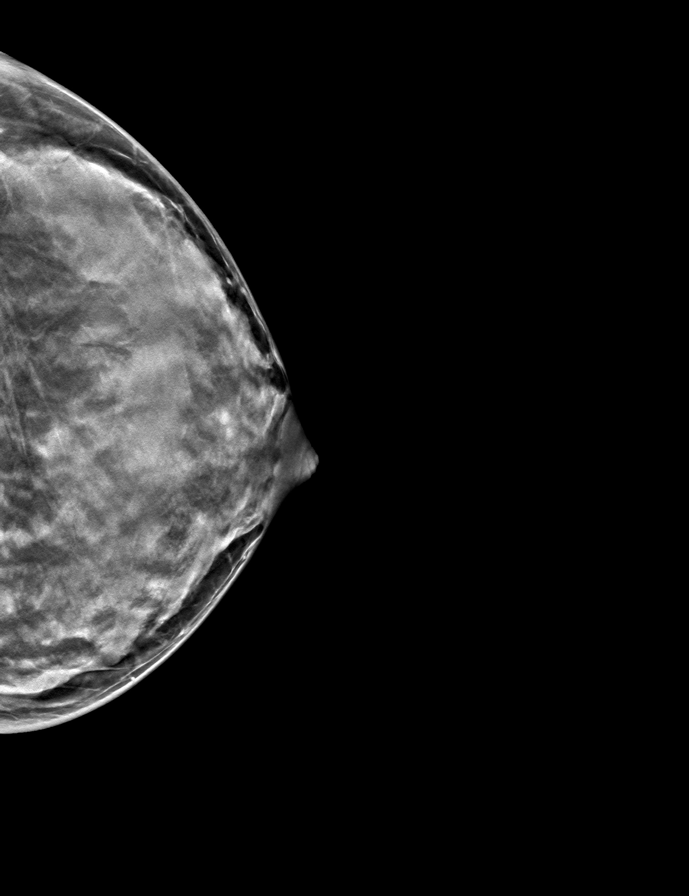

[9 of 24 positions shown; findings below may reference images not displayed]

ACR Breast Density Category d: The breast tissue is extremely dense,
which lowers the sensitivity of mammography
FINDINGS: There are no findings suspicious for malignancy.
IMPRESSION: No mammographic evidence of malignancy. A result letter of this
screening mammogram will be mailed directly to the patient.

RECOMMENDATION:
Screening mammogram in one year. (Code:TA-V-WV9)

BI-RADS CATEGORY  1: Negative.

## 2023-01-12 ENCOUNTER — Other Ambulatory Visit: Payer: Self-pay | Admitting: Internal Medicine

## 2023-01-12 DIAGNOSIS — Z1231 Encounter for screening mammogram for malignant neoplasm of breast: Secondary | ICD-10-CM

## 2023-11-10 ENCOUNTER — Ambulatory Visit (INDEPENDENT_AMBULATORY_CARE_PROVIDER_SITE_OTHER): Admitting: Podiatry

## 2023-11-10 DIAGNOSIS — M62462 Contracture of muscle, left lower leg: Secondary | ICD-10-CM | POA: Diagnosis not present

## 2023-11-10 DIAGNOSIS — M722 Plantar fascial fibromatosis: Secondary | ICD-10-CM

## 2023-11-10 NOTE — Progress Notes (Signed)
 Subjective:  Patient ID: Stacey Peters, female    DOB: [birth year],  MRN: [id number]  Chief Complaint  Patient presents with   Foot Pain    Foot pain in the heel     48 y.o. female presents with the above complaint.  Patient presents with new complaint of left heel pain that has been going on for quite some time came out of nowhere hurts with ambulation and shoe pressure patient would like to discuss treatment options for it.  Pain scale 7 out of 10 dull achy in nature.  She denies any other acute issues.   Review of Systems: Negative except as noted in the HPI. Denies N/V/F/Ch.  No past medical history on file.  Current Outpatient Medications:    Cholecalciferol (VITAMIN D-1000 MAX ST) 25 MCG (1000 UT) tablet, Take by mouth., Disp: , Rfl:    Ciclopirox (CICLOPIROX TREATMENT) 8 % KIT, Apply topically daily., Disp: , Rfl:    clindamycin (CLEOCIN) 2 % vaginal cream, Apply topically., Disp: , Rfl:    doxycycline (VIBRAMYCIN) 100 MG capsule, Take 100 mg by mouth 2 (two) times daily., Disp: , Rfl:    Fluocinolone Acetonide 0.01 % OIL, INSTIL 4 DROPS AS NEEDED FOR ITCHING, Disp: , Rfl:    fluticasone (FLONASE) 50 MCG/ACT nasal spray, SPRAY 2 SPRAYS BY NASAL ROUTE DAILY FOR 30 DAYS., Disp: , Rfl:    levonorgestrel (MIRENA) 20 MCG/DAY IUD, by Intrauterine route., Disp: , Rfl:    metroNIDAZOLE (FLAGYL) 500 MG tablet, Take by mouth., Disp: , Rfl:    Multiple Vitamin (MULTIVITAMIN) capsule, Take by mouth., Disp: , Rfl:    nitrofurantoin, macrocrystal-monohydrate, (MACROBID) 100 MG capsule, Take 100 mg by mouth every 12 (twelve) hours., Disp: , Rfl:    omeprazole (PRILOSEC) 40 MG capsule, TAKE 1 CAPSULE (40 MG TOTAL) BY MOUTH DAILY FOR 30 DAYS., Disp: , Rfl:    Probiotic Product (PROBIOTIC & ACIDOPHILUS EX ST) CAPS, Take by mouth., Disp: , Rfl:    ranitidine (ZANTAC) 150 MG capsule, Take by mouth., Disp: , Rfl:    terbinafine  (LAMISIL ) 250 MG tablet, Take 1 tablet (250 mg total) by mouth daily.,  Disp: 90 tablet, Rfl: 0   triamcinolone  ointment (KENALOG) 0.1 %, Apply topically 2 (two) times daily., Disp: , Rfl:   Social History   Tobacco Use  Smoking Status Never  Smokeless Tobacco Never    No Known Allergies Objective:  There were no vitals filed for this visit. There is no height or weight on file to calculate BMI. Constitutional Well developed. Well nourished.  Vascular Dorsalis pedis pulses palpable bilaterally. Posterior tibial pulses palpable bilaterally. Capillary refill normal to all digits.  No cyanosis or clubbing noted. Pedal hair growth normal.  Neurologic Normal speech. Oriented to person, place, and time. Epicritic sensation to light touch grossly present bilaterally.  Dermatologic Nails well groomed and normal in appearance. No open wounds. No skin lesions.  Orthopedic: Normal joint ROM without pain or crepitus bilaterally. No visible deformities. Tender to palpation at the calcaneal tuber left. No pain with calcaneal squeeze left. Ankle ROM diminished range of motion left. Silfverskiold Test: positive left.   Radiographs: None  Assessment:   1. Plantar fasciitis of left foot   2. Gastrocnemius equinus, left    Plan:  Patient was evaluated and treated and all questions answered.  Plantar Fasciitis, left with left gastrocnemius equinus - XR reviewed as above.  - Educated on icing and stretching. Instructions given.  - Injection delivered to the  plantar fascia as below. - DME: Plantar fascial brace dispensed to support the medial longitudinal arch of the foot and offload pressure from the heel and prevent arch collapse during weightbearing - Pharmacologic management: None  Procedure: Injection Tendon/Ligament Location: Left plantar fascia at the glabrous junction; medial approach. Skin Prep: alcohol Injectate: 0.5 cc 0.5% marcaine plain, 0.5 cc of 1% Lidocaine, 0.5 cc kenalog 10. Disposition: Patient tolerated procedure well. Injection site  dressed with a band-aid.  No follow-ups on file.

## 2023-12-08 ENCOUNTER — Ambulatory Visit: Admitting: Podiatry

## 2023-12-08 DIAGNOSIS — M216X1 Other acquired deformities of right foot: Secondary | ICD-10-CM

## 2023-12-08 DIAGNOSIS — M722 Plantar fascial fibromatosis: Secondary | ICD-10-CM

## 2023-12-08 DIAGNOSIS — M216X2 Other acquired deformities of left foot: Secondary | ICD-10-CM | POA: Diagnosis not present

## 2023-12-08 NOTE — Progress Notes (Signed)
 Subjective:  Patient ID: Stacey Peters, female    DOB: 1975-12-03,  MRN: [id number]  Chief Complaint  Patient presents with   Plantar Fasciitis    Pt stated that it is better than it was but she still has some discomfort here and there     48 y.o. female presents with the above complaint.  Patient presents with new complaint of left heel pain that has been going on for quite some time came out of nowhere hurts with ambulation and shoe pressure patient would like to discuss treatment options for it.  Pain scale 7 out of 10 dull achy in nature.  She denies any other acute issues.   Review of Systems: Negative except as noted in the HPI. Denies N/V/F/Ch.  No past medical history on file.  Current Outpatient Medications:    Cholecalciferol (VITAMIN D-1000 MAX ST) 25 MCG (1000 UT) tablet, Take by mouth., Disp: , Rfl:    Ciclopirox (CICLOPIROX TREATMENT) 8 % KIT, Apply topically daily., Disp: , Rfl:    clindamycin (CLEOCIN) 2 % vaginal cream, Apply topically., Disp: , Rfl:    doxycycline (VIBRAMYCIN) 100 MG capsule, Take 100 mg by mouth 2 (two) times daily., Disp: , Rfl:    Fluocinolone Acetonide 0.01 % OIL, INSTIL 4 DROPS AS NEEDED FOR ITCHING, Disp: , Rfl:    fluticasone (FLONASE) 50 MCG/ACT nasal spray, SPRAY 2 SPRAYS BY NASAL ROUTE DAILY FOR 30 DAYS., Disp: , Rfl:    levonorgestrel (MIRENA) 20 MCG/DAY IUD, by Intrauterine route., Disp: , Rfl:    metroNIDAZOLE (FLAGYL) 500 MG tablet, Take by mouth., Disp: , Rfl:    Multiple Vitamin (MULTIVITAMIN) capsule, Take by mouth., Disp: , Rfl:    nitrofurantoin, macrocrystal-monohydrate, (MACROBID) 100 MG capsule, Take 100 mg by mouth every 12 (twelve) hours., Disp: , Rfl:    omeprazole (PRILOSEC) 40 MG capsule, TAKE 1 CAPSULE (40 MG TOTAL) BY MOUTH DAILY FOR 30 DAYS., Disp: , Rfl:    Probiotic Product (PROBIOTIC & ACIDOPHILUS EX ST) CAPS, Take by mouth., Disp: , Rfl:    ranitidine (ZANTAC) 150 MG capsule, Take by mouth., Disp: , Rfl:     terbinafine  (LAMISIL ) 250 MG tablet, Take 1 tablet (250 mg total) by mouth daily., Disp: 90 tablet, Rfl: 0   triamcinolone  ointment (KENALOG) 0.1 %, Apply topically 2 (two) times daily., Disp: , Rfl:   Social History   Tobacco Use  Smoking Status Never  Smokeless Tobacco Never    Allergies  Allergen Reactions   Nitrofurantoin Hives   Sulfa Antibiotics Hives   Objective:  There were no vitals filed for this visit. There is no height or weight on file to calculate BMI. Constitutional Well developed. Well nourished.  Vascular Dorsalis pedis pulses palpable bilaterally. Posterior tibial pulses palpable bilaterally. Capillary refill normal to all digits.  No cyanosis or clubbing noted. Pedal hair growth normal.  Neurologic Normal speech. Oriented to person, place, and time. Epicritic sensation to light touch grossly present bilaterally.  Dermatologic Nails well groomed and normal in appearance. No open wounds. No skin lesions.  Orthopedic: Normal joint ROM without pain or crepitus bilaterally. No visible deformities. Tender to palpation at the calcaneal tuber left. No pain with calcaneal squeeze left. Ankle ROM diminished range of motion left. Silfverskiold Test: positive left.   Radiographs: None  Assessment:   1. Other acquired deformities of right foot   2. Other acquired deformities of left foot   3. Plantar fasciitis of left foot     Plan:  Patient was evaluated and treated and all questions answered.  Plantar Fasciitis, left with left gastrocnemius equinus - XR reviewed as above.  - Educated on icing and stretching. Instructions given.  - second Injection delivered to the plantar fascia as below. - DME: Plantar fascial brace dispensed to support the medial longitudinal arch of the foot and offload pressure from the heel and prevent arch collapse during weightbearing - Pharmacologic management: None  Pes planovalgus foot deformity -I explained to patient the  etiology of pes planovalgus and relationship with Planter fasciitis and various treatment options were discussed.  Given patient foot structure in the setting of Planter fasciitis I believe patient will benefit from custom-made orthotics to help control the hindfoot motion support the arch of the foot and take the stress away from plantar fascial.  Patient agrees with the plan like to proceed with orthotics -Patient was casted for orthotics   Procedure: Injection Tendon/Ligament Location: Left plantar fascia at the glabrous junction; medial approach. Skin Prep: alcohol Injectate: 0.5 cc 0.5% marcaine plain, 0.5 cc of 1% Lidocaine, 0.5 cc kenalog 10. Disposition: Patient tolerated procedure well. Injection site dressed with a band-aid.  No follow-ups on file.

## 2023-12-29 NOTE — Progress Notes (Signed)
 No as there is no auth needed under $750 / our orthotics are $542 so no, no auth request was submitted

## 2024-02-10 ENCOUNTER — Telehealth: Payer: Self-pay

## 2024-02-10 NOTE — Telephone Encounter (Signed)
 Orthotics have arrived- balance $0

## 2024-02-11 NOTE — Telephone Encounter (Signed)
 LVM to schedule orthotic fitting/ pu

## 2024-02-28 ENCOUNTER — Ambulatory Visit

## 2024-02-28 NOTE — Progress Notes (Signed)
 Patient presents today to pick up custom molded foot orthotics, diagnosed with foot deformity by Dr. Tobie.   Orthotics were dispensed and fit was satisfactory. Reviewed instructions for break-in and wear. Written instructions given to patient.  Patient will follow up as needed.   Lolita Schultze Cped, CFo, CFm

## 2024-05-03 ENCOUNTER — Ambulatory Visit: Admitting: Podiatry

## 2024-05-03 DIAGNOSIS — M62462 Contracture of muscle, left lower leg: Secondary | ICD-10-CM | POA: Diagnosis not present

## 2024-05-03 DIAGNOSIS — M722 Plantar fascial fibromatosis: Secondary | ICD-10-CM

## 2024-05-03 NOTE — Progress Notes (Signed)
 "  Subjective:  Patient ID: Stacey Peters, female    DOB: 1976-03-11,  MRN: [id number]  Chief Complaint  Patient presents with   Foot Pain    Pt stated that she started having some throbbing discomfort she would like to get an injection     48 y.o. female presents with the above complaint.  Patient presents with new complaint of left heel pain that has been going on for quite some time came out of nowhere hurts with ambulation and shoe pressure patient would like to discuss treatment options for it.  Pain scale 7 out of 10 dull achy in nature.  She denies any other acute issues.   Review of Systems: Negative except as noted in the HPI. Denies N/V/F/Ch.  No past medical history on file.  Current Outpatient Medications:    Cholecalciferol (VITAMIN D-1000 MAX ST) 25 MCG (1000 UT) tablet, Take by mouth., Disp: , Rfl:    Ciclopirox (CICLOPIROX TREATMENT) 8 % KIT, Apply topically daily., Disp: , Rfl:    clindamycin (CLEOCIN) 2 % vaginal cream, Apply topically., Disp: , Rfl:    doxycycline (VIBRAMYCIN) 100 MG capsule, Take 100 mg by mouth 2 (two) times daily., Disp: , Rfl:    Fluocinolone Acetonide 0.01 % OIL, INSTIL 4 DROPS AS NEEDED FOR ITCHING, Disp: , Rfl:    fluticasone (FLONASE) 50 MCG/ACT nasal spray, SPRAY 2 SPRAYS BY NASAL ROUTE DAILY FOR 30 DAYS., Disp: , Rfl:    levonorgestrel (MIRENA) 20 MCG/DAY IUD, by Intrauterine route., Disp: , Rfl:    metroNIDAZOLE (FLAGYL) 500 MG tablet, Take by mouth., Disp: , Rfl:    Multiple Vitamin (MULTIVITAMIN) capsule, Take by mouth., Disp: , Rfl:    nitrofurantoin, macrocrystal-monohydrate, (MACROBID) 100 MG capsule, Take 100 mg by mouth every 12 (twelve) hours., Disp: , Rfl:    omeprazole (PRILOSEC) 40 MG capsule, TAKE 1 CAPSULE (40 MG TOTAL) BY MOUTH DAILY FOR 30 DAYS., Disp: , Rfl:    Probiotic Product (PROBIOTIC & ACIDOPHILUS EX ST) CAPS, Take by mouth., Disp: , Rfl:    ranitidine (ZANTAC) 150 MG capsule, Take by mouth., Disp: , Rfl:     terbinafine  (LAMISIL ) 250 MG tablet, Take 1 tablet (250 mg total) by mouth daily., Disp: 90 tablet, Rfl: 0   triamcinolone  ointment (KENALOG) 0.1 %, Apply topically 2 (two) times daily., Disp: , Rfl:   Social History   Tobacco Use  Smoking Status Never  Smokeless Tobacco Never    Allergies  Allergen Reactions   Nitrofurantoin Hives   Sulfa Antibiotics Hives   Objective:  There were no vitals filed for this visit. There is no height or weight on file to calculate BMI. Constitutional Well developed. Well nourished.  Vascular Dorsalis pedis pulses palpable bilaterally. Posterior tibial pulses palpable bilaterally. Capillary refill normal to all digits.  No cyanosis or clubbing noted. Pedal hair growth normal.  Neurologic Normal speech. Oriented to person, place, and time. Epicritic sensation to light touch grossly present bilaterally.  Dermatologic Nails well groomed and normal in appearance. No open wounds. No skin lesions.  Orthopedic: Normal joint ROM without pain or crepitus bilaterally. No visible deformities. Tender to palpation at the calcaneal tuber left. No pain with calcaneal squeeze left. Ankle ROM diminished range of motion left. Silfverskiold Test: positive left.   Radiographs: None  Assessment:   1. Gastrocnemius equinus, left   2. Plantar fasciitis of left foot     Plan:  Patient was evaluated and treated and all questions answered.  Plantar  Fasciitis, left with left gastrocnemius equinus~recurrence  - XR reviewed as above.  - Educated on icing and stretching. Instructions given.  - Injection delivered to the plantar fascia as below. - DME: Plantar fascial brace dispensed to support the medial longitudinal arch of the foot and offload pressure from the heel and prevent arch collapse during weightbearing - Pharmacologic management: None  Procedure: Injection Tendon/Ligament Location: Left plantar fascia at the glabrous junction; medial  approach. Skin Prep: alcohol Injectate: 0.5 cc 0.5% marcaine plain, 0.5 cc of 1% Lidocaine, 0.5 cc kenalog 10. Disposition: Patient tolerated procedure well. Injection site dressed with a band-aid.  No follow-ups on file. "

## 2024-05-22 ENCOUNTER — Other Ambulatory Visit: Payer: Self-pay

## 2024-05-24 LAB — SURGICAL PATHOLOGY
